# Patient Record
Sex: Male | Born: 1960
Health system: Southern US, Community
[De-identification: ages and names within clinical notes are randomized; demographics above are authoritative.]

## PROBLEM LIST (undated history)

## (undated) DIAGNOSIS — K219 Gastro-esophageal reflux disease without esophagitis: Secondary | ICD-10-CM

## (undated) DIAGNOSIS — M1990 Unspecified osteoarthritis, unspecified site: Secondary | ICD-10-CM

## (undated) DIAGNOSIS — G4730 Sleep apnea, unspecified: Secondary | ICD-10-CM

## (undated) DIAGNOSIS — T7840XA Allergy, unspecified, initial encounter: Secondary | ICD-10-CM

## (undated) DIAGNOSIS — J3089 Other allergic rhinitis: Secondary | ICD-10-CM

## (undated) DIAGNOSIS — I2510 Atherosclerotic heart disease of native coronary artery without angina pectoris: Secondary | ICD-10-CM

## (undated) DIAGNOSIS — I10 Essential (primary) hypertension: Secondary | ICD-10-CM

## (undated) DIAGNOSIS — E785 Hyperlipidemia, unspecified: Secondary | ICD-10-CM

## (undated) HISTORY — DX: Other allergic rhinitis: J30.89

## (undated) HISTORY — PX: ACHILLES TENDON REPAIR: SUR1153

## (undated) HISTORY — DX: Hyperlipidemia, unspecified: E78.5

## (undated) HISTORY — PX: COLONOSCOPY: SHX174

## (undated) HISTORY — DX: Sleep apnea, unspecified: G47.30

## (undated) HISTORY — DX: Allergy, unspecified, initial encounter: T78.40XA

## (undated) HISTORY — DX: Gastro-esophageal reflux disease without esophagitis: K21.9

## (undated) HISTORY — DX: Essential (primary) hypertension: I10

## (undated) HISTORY — PX: ELBOW FRACTURE SURGERY: SHX616

## (undated) HISTORY — DX: Atherosclerotic heart disease of native coronary artery without angina pectoris: I25.10

## (undated) HISTORY — PX: WISDOM TOOTH EXTRACTION: SHX21

## (undated) HISTORY — DX: Unspecified osteoarthritis, unspecified site: M19.90

---

## 2015-07-29 HISTORY — PX: COLONOSCOPY: SHX174

## 2016-01-10 ENCOUNTER — Telehealth: Payer: Self-pay | Admitting: Gastroenterology

## 2016-01-10 NOTE — Telephone Encounter (Signed)
Received Colon reports and placed on Dr. Lynne Leader desk for review.

## 2016-01-14 NOTE — Telephone Encounter (Signed)
Dr. Stark reviewed records and has accepted patient. Ok to schedule Direct Colon. Left message for patient to return my call °

## 2016-01-17 ENCOUNTER — Encounter: Payer: Self-pay | Admitting: Gastroenterology

## 2016-01-17 NOTE — Telephone Encounter (Signed)
Colonoscopy scheduled.

## 2016-03-13 ENCOUNTER — Ambulatory Visit (AMBULATORY_SURGERY_CENTER): Payer: Self-pay

## 2016-03-13 VITALS — Ht 66.0 in | Wt 250.0 lb

## 2016-03-13 DIAGNOSIS — Z800 Family history of malignant neoplasm of digestive organs: Secondary | ICD-10-CM

## 2016-03-13 MED ORDER — SUPREP BOWEL PREP KIT 17.5-3.13-1.6 GM/177ML PO SOLN: 1.0000 | Freq: Once | ORAL | 0 refills | Status: AC

## 2016-03-13 NOTE — Progress Notes (Signed)
No allergies to eggs or soy No past problems with anesthesia No diet meds No home oxygen cpap only  Has email and internet; declined emmi

## 2016-03-19 ENCOUNTER — Encounter: Payer: Self-pay | Admitting: Gastroenterology

## 2016-03-27 ENCOUNTER — Encounter: Payer: Self-pay | Admitting: Gastroenterology

## 2016-03-27 ENCOUNTER — Ambulatory Visit (AMBULATORY_SURGERY_CENTER): Payer: 59 | Admitting: Gastroenterology

## 2016-03-27 VITALS — BP 147/90 | HR 65 | Temp 97.3°F | Resp 19 | Ht 66.0 in | Wt 250.0 lb

## 2016-03-27 DIAGNOSIS — Z1211 Encounter for screening for malignant neoplasm of colon: Secondary | ICD-10-CM | POA: Diagnosis not present

## 2016-03-27 DIAGNOSIS — Z800 Family history of malignant neoplasm of digestive organs: Secondary | ICD-10-CM

## 2016-03-27 DIAGNOSIS — D120 Benign neoplasm of cecum: Secondary | ICD-10-CM | POA: Diagnosis not present

## 2016-03-27 MED ORDER — SODIUM CHLORIDE 0.9 % IV SOLN: 500.0000 mL | INTRAVENOUS | Status: AC

## 2016-03-27 NOTE — Progress Notes (Signed)
Called to room to assist during endoscopic procedure.  Patient ID and intended procedure confirmed with present staff. Received instructions for my participation in the procedure from the performing physician.  

## 2016-03-27 NOTE — Patient Instructions (Signed)
Discharge instructions given. Handouts on polyps,diverticulosis and hemorrhoids. Resume previous medications. YOU HAD AN ENDOSCOPIC PROCEDURE TODAY AT THE [facility]:   Refer to the procedure report that was given to you for any specific questions about what was found during the examination.  If the procedure report does not answer your questions, please call your gastroenterologist to clarify.  If you requested that your care partner not be given the details of your procedure findings, then the procedure report has been included in a sealed envelope for you to review at your convenience later.  YOU SHOULD EXPECT: Some feelings of bloating in the abdomen. Passage of more gas than usual.  Walking can help get rid of the air that was put into your GI tract during the procedure and reduce the bloating. If you had a lower endoscopy (such as a colonoscopy or flexible sigmoidoscopy) you may notice spotting of blood in your stool or on the toilet paper. If you underwent a bowel prep for your procedure, you may not have a normal bowel movement for a few days.  Please Note:  You might notice some irritation and congestion in your nose or some drainage.  This is from the oxygen used during your procedure.  There is no need for concern and it should clear up in a day or so.  SYMPTOMS TO REPORT IMMEDIATELY:   Following lower endoscopy (colonoscopy or flexible sigmoidoscopy):  Excessive amounts of blood in the stool  Significant tenderness or worsening of abdominal pains  Swelling of the abdomen that is new, acute  Fever of 100F or higher   For urgent or emergent issues, a gastroenterologist can be reached at any hour by calling [phone].   DIET:  We do recommend a small meal at first, but then you may proceed to your regular diet.  Drink plenty of fluids but you should avoid alcoholic beverages for 24 hours.  ACTIVITY:  You should plan to take it easy for the rest of today and you  should NOT DRIVE or use heavy machinery until tomorrow (because of the sedation medicines used during the test).    FOLLOW UP: Our staff will call the number listed on your records the next business day following your procedure to check on you and address any questions or concerns that you may have regarding the information given to you following your procedure. If we do not reach you, we will leave a message.  However, if you are feeling well and you are not experiencing any problems, there is no need to return our call.  We will assume that you have returned to your regular daily activities without incident.  If any biopsies were taken you will be contacted by phone or by letter within the next 1-3 weeks.  Please call us at [phone] if you have not heard about the biopsies in 3 weeks.    SIGNATURES/CONFIDENTIALITY: You and/or your care partner have signed paperwork which will be entered into your electronic medical record.  These signatures attest to the fact that that the information above on your After Visit Summary has been reviewed and is understood.  Full responsibility of the confidentiality of this discharge information lies with you and/or your care-partner. 

## 2016-03-27 NOTE — Progress Notes (Signed)
Report to PACU, RN, vss, BBS= Clear.  

## 2016-03-27 NOTE — Op Note (Signed)
[place] Patient Name: Norman Stanton Procedure Date: 03/27/2016 9:45 AM MRN: [id number] Endoscopist: Ladene Artist , MD Age: 55 Referring MD:  Date of Birth: [birth year] Gender: Male Account #: [id number] Procedure:                Colonoscopy Indications:              Screening in patient at increased risk: Family                            history of 1st-degree relative with colorectal                            cancer Medicines:                Monitored Anesthesia Care Procedure:                Pre-Anesthesia Assessment:                           - Prior to the procedure, a History and Physical                            was performed, and patient medications and                            allergies were reviewed. The patient's tolerance of                            previous anesthesia was also reviewed. The risks                            and benefits of the procedure and the sedation                            options and risks were discussed with the patient.                            All questions were answered, and informed consent                            was obtained. Prior Anticoagulants: The patient has                            taken no previous anticoagulant or antiplatelet                            agents. ASA Grade Assessment: III - A patient with                            severe systemic disease. After reviewing the risks                            and benefits, the patient was deemed in  satisfactory condition to undergo the procedure.                           After obtaining informed consent, the colonoscope                            was passed under direct vision. Throughout the                            procedure, the patient's blood pressure, pulse, and                            oxygen saturations were monitored continuously. The                            Model PCF-[id number]) scope was introduced                             through the anus and advanced to the the cecum,                            identified by appendiceal orifice and ileocecal                            valve. The ileocecal valve, appendiceal orifice,                            and rectum were photographed. The quality of the                            bowel preparation was good. The colonoscopy was                            performed without difficulty. The patient tolerated                            the procedure well. Scope In: 10:08:46 AM Scope Out: 10:23:47 AM Scope Withdrawal Time: 0 hours 10 minutes 20 seconds  Total Procedure Duration: 0 hours 15 minutes 1 second  Findings:                 A 5 mm polyp was found in the cecum. The polyp was                            sessile. The polyp was removed with a cold biopsy                            forceps. Resection and retrieval were complete.                           Internal hemorrhoids were found during                            retroflexion. The hemorrhoids were small and Grade  I (internal hemorrhoids that do not prolapse).                           The exam was otherwise without abnormality on                            direct and retroflexion views.                           A few small-mouthed diverticula were found in the                            sigmoid colon, descending colon and ascending                            colon. There was no evidence of diverticular                            bleeding. Complications:            No immediate complications. Estimated blood loss:                            None. Estimated Blood Loss:     Estimated blood loss: none. Impression:               - One 5 mm polyp in the cecum, removed with a cold                            biopsy forceps. Resected and retrieved.                           - Internal hemorrhoids.                           - Mild diverticulosis in the sigmoid colon, in the                             descending colon and in the ascending colon.                           - The examination was otherwise normal Recommendation:           - Repeat colonoscopy in 5 years for surveillance.                           - Patient has a contact number available for                            emergencies. The signs and symptoms of potential                            delayed complications were discussed with the                            patient. Return to normal activities tomorrow.  Written discharge instructions were provided to the                            patient.                           - High fiber diet.                           - Continue present medications.                           - Await pathology results. Ladene Artist, MD 03/27/2016 10:28:12 AM This report has been signed electronically.

## 2016-03-28 ENCOUNTER — Telehealth: Payer: Self-pay | Admitting: *Deleted

## 2016-03-28 NOTE — Telephone Encounter (Signed)

## 2016-04-02 ENCOUNTER — Encounter: Payer: Self-pay | Admitting: Gastroenterology

## 2019-02-25 DIAGNOSIS — G4733 Obstructive sleep apnea (adult) (pediatric): Secondary | ICD-10-CM | POA: Diagnosis not present

## 2019-04-28 DIAGNOSIS — I10 Essential (primary) hypertension: Secondary | ICD-10-CM | POA: Diagnosis not present

## 2019-04-28 DIAGNOSIS — E782 Mixed hyperlipidemia: Secondary | ICD-10-CM | POA: Diagnosis not present

## 2019-04-28 DIAGNOSIS — Z7982 Long term (current) use of aspirin: Secondary | ICD-10-CM | POA: Diagnosis not present

## 2019-04-28 DIAGNOSIS — I2510 Atherosclerotic heart disease of native coronary artery without angina pectoris: Secondary | ICD-10-CM | POA: Diagnosis not present

## 2019-10-06 DIAGNOSIS — I10 Essential (primary) hypertension: Secondary | ICD-10-CM | POA: Diagnosis not present

## 2019-10-06 DIAGNOSIS — N401 Enlarged prostate with lower urinary tract symptoms: Secondary | ICD-10-CM | POA: Diagnosis not present

## 2019-10-06 DIAGNOSIS — Z0000 Encounter for general adult medical examination without abnormal findings: Secondary | ICD-10-CM | POA: Diagnosis not present

## 2019-10-06 DIAGNOSIS — N529 Male erectile dysfunction, unspecified: Secondary | ICD-10-CM | POA: Diagnosis not present

## 2019-10-06 DIAGNOSIS — E782 Mixed hyperlipidemia: Secondary | ICD-10-CM | POA: Diagnosis not present

## 2019-10-06 DIAGNOSIS — I2510 Atherosclerotic heart disease of native coronary artery without angina pectoris: Secondary | ICD-10-CM | POA: Diagnosis not present

## 2019-10-06 DIAGNOSIS — N138 Other obstructive and reflux uropathy: Secondary | ICD-10-CM | POA: Diagnosis not present

## 2019-10-06 DIAGNOSIS — Z7982 Long term (current) use of aspirin: Secondary | ICD-10-CM | POA: Diagnosis not present

## 2019-10-11 DIAGNOSIS — Z6841 Body Mass Index (BMI) 40.0 and over, adult: Secondary | ICD-10-CM | POA: Diagnosis not present

## 2019-10-11 DIAGNOSIS — N529 Male erectile dysfunction, unspecified: Secondary | ICD-10-CM | POA: Diagnosis not present

## 2019-10-11 DIAGNOSIS — Z0000 Encounter for general adult medical examination without abnormal findings: Secondary | ICD-10-CM | POA: Diagnosis not present

## 2019-10-12 DIAGNOSIS — G4733 Obstructive sleep apnea (adult) (pediatric): Secondary | ICD-10-CM | POA: Diagnosis not present

## 2019-10-31 DIAGNOSIS — I2510 Atherosclerotic heart disease of native coronary artery without angina pectoris: Secondary | ICD-10-CM | POA: Diagnosis not present

## 2019-10-31 DIAGNOSIS — I10 Essential (primary) hypertension: Secondary | ICD-10-CM | POA: Diagnosis not present

## 2019-10-31 DIAGNOSIS — Z7982 Long term (current) use of aspirin: Secondary | ICD-10-CM | POA: Diagnosis not present

## 2019-10-31 DIAGNOSIS — E782 Mixed hyperlipidemia: Secondary | ICD-10-CM | POA: Diagnosis not present

## 2020-01-04 DIAGNOSIS — G4733 Obstructive sleep apnea (adult) (pediatric): Secondary | ICD-10-CM | POA: Diagnosis not present

## 2020-02-14 DIAGNOSIS — R7303 Prediabetes: Secondary | ICD-10-CM | POA: Diagnosis not present

## 2020-05-02 DIAGNOSIS — L821 Other seborrheic keratosis: Secondary | ICD-10-CM | POA: Diagnosis not present

## 2020-05-02 DIAGNOSIS — D485 Neoplasm of uncertain behavior of skin: Secondary | ICD-10-CM | POA: Diagnosis not present

## 2020-05-02 DIAGNOSIS — D1801 Hemangioma of skin and subcutaneous tissue: Secondary | ICD-10-CM | POA: Diagnosis not present

## 2020-05-02 DIAGNOSIS — C44319 Basal cell carcinoma of skin of other parts of face: Secondary | ICD-10-CM | POA: Diagnosis not present

## 2020-05-07 DIAGNOSIS — I10 Essential (primary) hypertension: Secondary | ICD-10-CM | POA: Diagnosis not present

## 2020-05-07 DIAGNOSIS — Z7982 Long term (current) use of aspirin: Secondary | ICD-10-CM | POA: Diagnosis not present

## 2020-05-07 DIAGNOSIS — E782 Mixed hyperlipidemia: Secondary | ICD-10-CM | POA: Diagnosis not present

## 2020-05-07 DIAGNOSIS — I2510 Atherosclerotic heart disease of native coronary artery without angina pectoris: Secondary | ICD-10-CM | POA: Diagnosis not present

## 2020-06-05 DIAGNOSIS — G4733 Obstructive sleep apnea (adult) (pediatric): Secondary | ICD-10-CM | POA: Diagnosis not present

## 2020-06-28 DIAGNOSIS — C44319 Basal cell carcinoma of skin of other parts of face: Secondary | ICD-10-CM | POA: Diagnosis not present

## 2020-08-13 DIAGNOSIS — H524 Presbyopia: Secondary | ICD-10-CM | POA: Diagnosis not present

## 2020-08-13 DIAGNOSIS — Z135 Encounter for screening for eye and ear disorders: Secondary | ICD-10-CM | POA: Diagnosis not present

## 2020-08-13 DIAGNOSIS — H5203 Hypermetropia, bilateral: Secondary | ICD-10-CM | POA: Diagnosis not present

## 2020-08-13 DIAGNOSIS — H52223 Regular astigmatism, bilateral: Secondary | ICD-10-CM | POA: Diagnosis not present

## 2020-11-14 DIAGNOSIS — R079 Chest pain, unspecified: Secondary | ICD-10-CM | POA: Diagnosis not present

## 2020-11-14 DIAGNOSIS — I2510 Atherosclerotic heart disease of native coronary artery without angina pectoris: Secondary | ICD-10-CM | POA: Diagnosis not present

## 2020-11-14 DIAGNOSIS — I10 Essential (primary) hypertension: Secondary | ICD-10-CM | POA: Diagnosis not present

## 2020-11-14 DIAGNOSIS — E782 Mixed hyperlipidemia: Secondary | ICD-10-CM | POA: Diagnosis not present

## 2020-12-11 DIAGNOSIS — I2510 Atherosclerotic heart disease of native coronary artery without angina pectoris: Secondary | ICD-10-CM | POA: Diagnosis not present

## 2020-12-14 DIAGNOSIS — M25511 Pain in right shoulder: Secondary | ICD-10-CM | POA: Diagnosis not present

## 2020-12-14 DIAGNOSIS — I2510 Atherosclerotic heart disease of native coronary artery without angina pectoris: Secondary | ICD-10-CM | POA: Diagnosis not present

## 2020-12-14 DIAGNOSIS — E785 Hyperlipidemia, unspecified: Secondary | ICD-10-CM | POA: Diagnosis not present

## 2020-12-14 DIAGNOSIS — Z125 Encounter for screening for malignant neoplasm of prostate: Secondary | ICD-10-CM | POA: Diagnosis not present

## 2020-12-14 DIAGNOSIS — R7303 Prediabetes: Secondary | ICD-10-CM | POA: Diagnosis not present

## 2020-12-14 DIAGNOSIS — Z0000 Encounter for general adult medical examination without abnormal findings: Secondary | ICD-10-CM | POA: Diagnosis not present

## 2020-12-14 DIAGNOSIS — M25551 Pain in right hip: Secondary | ICD-10-CM | POA: Diagnosis not present

## 2020-12-14 DIAGNOSIS — I10 Essential (primary) hypertension: Secondary | ICD-10-CM | POA: Diagnosis not present

## 2020-12-18 DIAGNOSIS — G4733 Obstructive sleep apnea (adult) (pediatric): Secondary | ICD-10-CM | POA: Diagnosis not present

## 2021-01-03 DIAGNOSIS — R079 Chest pain, unspecified: Secondary | ICD-10-CM | POA: Diagnosis not present

## 2021-01-03 DIAGNOSIS — I2510 Atherosclerotic heart disease of native coronary artery without angina pectoris: Secondary | ICD-10-CM | POA: Diagnosis not present

## 2021-02-05 ENCOUNTER — Other Ambulatory Visit: Payer: Self-pay

## 2021-02-05 ENCOUNTER — Other Ambulatory Visit: Payer: Self-pay | Admitting: Physician Assistant

## 2021-02-05 ENCOUNTER — Ambulatory Visit
Admission: RE | Admit: 2021-02-05 | Discharge: 2021-02-05 | Disposition: A | Discharge status: Home / Self Care | Payer: BC Managed Care – PPO | Source: Ambulatory Visit | Attending: Physician Assistant | Admitting: Physician Assistant

## 2021-02-05 DIAGNOSIS — M25551 Pain in right hip: Secondary | ICD-10-CM | POA: Diagnosis not present

## 2021-02-05 DIAGNOSIS — M19011 Primary osteoarthritis, right shoulder: Secondary | ICD-10-CM | POA: Diagnosis not present

## 2021-02-05 DIAGNOSIS — M160 Bilateral primary osteoarthritis of hip: Secondary | ICD-10-CM | POA: Diagnosis not present

## 2021-02-05 DIAGNOSIS — M25511 Pain in right shoulder: Secondary | ICD-10-CM

## 2021-02-05 DIAGNOSIS — M25711 Osteophyte, right shoulder: Secondary | ICD-10-CM | POA: Diagnosis not present

## 2021-02-05 DIAGNOSIS — M25751 Osteophyte, right hip: Secondary | ICD-10-CM | POA: Diagnosis not present

## 2021-02-12 DIAGNOSIS — I10 Essential (primary) hypertension: Secondary | ICD-10-CM | POA: Diagnosis not present

## 2021-02-12 DIAGNOSIS — I2510 Atherosclerotic heart disease of native coronary artery without angina pectoris: Secondary | ICD-10-CM | POA: Diagnosis not present

## 2021-02-12 DIAGNOSIS — G4733 Obstructive sleep apnea (adult) (pediatric): Secondary | ICD-10-CM | POA: Diagnosis not present

## 2021-03-05 ENCOUNTER — Encounter (HOSPITAL_BASED_OUTPATIENT_CLINIC_OR_DEPARTMENT_OTHER): Payer: Self-pay

## 2021-03-05 DIAGNOSIS — R0683 Snoring: Secondary | ICD-10-CM

## 2021-03-05 DIAGNOSIS — G4733 Obstructive sleep apnea (adult) (pediatric): Secondary | ICD-10-CM

## 2021-03-05 DIAGNOSIS — G4710 Hypersomnia, unspecified: Secondary | ICD-10-CM

## 2021-03-08 ENCOUNTER — Encounter: Payer: Self-pay | Admitting: Gastroenterology

## 2021-03-15 ENCOUNTER — Ambulatory Visit (HOSPITAL_BASED_OUTPATIENT_CLINIC_OR_DEPARTMENT_OTHER): Payer: BC Managed Care – PPO | Attending: Sleep Medicine | Admitting: Sleep Medicine

## 2021-03-15 ENCOUNTER — Other Ambulatory Visit: Payer: Self-pay

## 2021-03-15 VITALS — Ht 66.0 in | Wt 248.0 lb

## 2021-03-15 DIAGNOSIS — G4731 Primary central sleep apnea: Secondary | ICD-10-CM

## 2021-03-15 DIAGNOSIS — G4733 Obstructive sleep apnea (adult) (pediatric): Secondary | ICD-10-CM | POA: Diagnosis present

## 2021-03-15 DIAGNOSIS — G4737 Central sleep apnea in conditions classified elsewhere: Secondary | ICD-10-CM | POA: Insufficient documentation

## 2021-03-15 DIAGNOSIS — G4710 Hypersomnia, unspecified: Secondary | ICD-10-CM

## 2021-03-15 DIAGNOSIS — R0683 Snoring: Secondary | ICD-10-CM

## 2021-03-21 DIAGNOSIS — G4733 Obstructive sleep apnea (adult) (pediatric): Secondary | ICD-10-CM | POA: Diagnosis not present

## 2021-03-21 NOTE — Procedures (Signed)
   NAME: Norman Stanton DATE OF BIRTH:  Jan 18, 1961 MEDICAL RECORD NUMBER [id number]  LOCATION: [facility]  PHYSICIAN: Keyetta Hollingworth D Ruchel Brandenburger  DATE OF STUDY: 03/15/2021  SLEEP STUDY TYPE: Nocturnal Polysomnogram               REFERRING PHYSICIAN: Elmarie Mainland, MD  CLINICAL INFORMATION Norman Stanton is a 60 year old Male and was referred to the sleep center for reassessment of obstructive sleep apnea.  MEDICATIONS Patient self administered medications include: N/A. No sleep medicine administered.  SLEEP STUDY TECHNIQUE A multi-channel overnight Polysomnography study was performed. The channels recorded and monitored were central and occipital EEG, electrooculogram (EOG), submentalis EMG (chin), nasal and oral airflow, thoracic and abdominal wall motion, anterior tibialis EMG, snore microphone, electrocardiogram, and a pulse oximetry. SLEEP ARCHITECTURE The study was initiated at 9:54:42 PM and terminated at 4:01:24 AM. The total recorded time was 366.7 minutes. EEG confirmed total sleep time was 80 minutes yielding a sleep efficiency of 21.8%. Sleep onset after lights out was 6.9 minutes with a REM latency of 319.5 minutes. The patient spent 20.0% of the night in stage N1 sleep, 60.6% in stage N2 sleep, 0.0% in stage N3 and 19.4% in REM. Wake after sleep onset (WASO) was 279.8 minutes. The Arousal Index was 51.8/hour. RESPIRATORY PARAMETERS There were a total of 98 respiratory disturbances out of which 80 were apneas (26 obstructive, 0 mixed, 54 central) and 18 hypopneas. The apnea/hypopnea index (AHI) was 73.5 events/hour. The central sleep apnea index was 40.5 events/hour. The REM AHI was 31.0 events/hour and NREM AHI was 83.7 events/hour. The supine AHI was 69.6 events/hour and the non supine AHI was 82.5 supine during 70.00% of sleep. Respiratory disturbances were associated with oxygen desaturation down to a nadir of 86.0% during sleep. The mean oxygen saturation during  the study was 93.8%.  LEG MOVEMENT DATA The total leg movements were 0 with a resulting leg movement index of 0.0/hr .Associated arousal with leg movement index was 0.0/hr.  CARDIAC DATA The underlying cardiac rhythm was most consistent with sinus rhythm. Mean heart rate during sleep was 54.8 bpm.  IMPRESSIONS - Severe Obstructive Sleep apnea (OSA) - EKG showed no cardiac abnormalities DIAGNOSIS - Obstructive Sleep Apnea (G47.33) - Central Sleep Apnea (G47.37) RECOMMENDATIONS - CPAP titration to determine optimal pressure required to alleviate sleep disordered breathing. BiPAP or ASV titration may be required to eliminate central sleep apnea. - Avoid alcohol, sedatives and other CNS depressants that may worsen sleep apnea and disrupt normal sleep architecture. - Sleep hygiene should be reviewed to assess factors that may improve sleep quality. - Weight management and regular exercise should be initiated or continued.  Norman Stanton D Aline Wesche Diplomate, American Board of Internal Medicine  ELECTRONICALLY SIGNED ON:  03/21/2021, 12:33 PM [place] PH: [phone]   FX: [phone] [place]

## 2021-04-17 DIAGNOSIS — G4733 Obstructive sleep apnea (adult) (pediatric): Secondary | ICD-10-CM | POA: Diagnosis not present

## 2021-04-17 DIAGNOSIS — I10 Essential (primary) hypertension: Secondary | ICD-10-CM | POA: Diagnosis not present

## 2021-05-09 ENCOUNTER — Other Ambulatory Visit: Payer: Self-pay

## 2021-05-09 ENCOUNTER — Encounter: Payer: Self-pay | Admitting: Gastroenterology

## 2021-05-09 ENCOUNTER — Ambulatory Visit (AMBULATORY_SURGERY_CENTER): Payer: BC Managed Care – PPO

## 2021-05-09 VITALS — Ht 66.0 in | Wt 250.0 lb

## 2021-05-09 DIAGNOSIS — Z86010 Personal history of colonic polyps: Secondary | ICD-10-CM

## 2021-05-09 MED ORDER — PEG-KCL-NACL-NASULF-NA ASC-C 100 G PO SOLR: 1.0000 | Freq: Once | ORAL | 0 refills | Status: AC

## 2021-05-09 NOTE — Progress Notes (Signed)
Pre visit completed via phone call; Patient verified name, DOB, and address; No egg or soy allergy known to patient  No issues known to pt with past sedation with any surgeries or procedures Patient denies ever being told they had issues or difficulty with intubation  No FH of Malignant Hyperthermia Pt is not on diet pills Pt is not on  home 02  Pt is not on blood thinners  Pt denies issues with constipation at this time; No A fib or A flutter NO PA's for preps discussed with pt in PV today  Discussed with pt there will be an out-of-pocket cost for prep and that varies from $0 to 70 +  dollars - pt verbalized understanding  Due to the COVID-19 pandemic we are asking patients to follow certain guidelines in PV and the [place]   Pt aware of COVID protocols and [facility] guidelines

## 2021-05-17 DIAGNOSIS — G4733 Obstructive sleep apnea (adult) (pediatric): Secondary | ICD-10-CM | POA: Diagnosis not present

## 2021-05-17 DIAGNOSIS — I10 Essential (primary) hypertension: Secondary | ICD-10-CM | POA: Diagnosis not present

## 2021-05-20 DIAGNOSIS — I2510 Atherosclerotic heart disease of native coronary artery without angina pectoris: Secondary | ICD-10-CM | POA: Diagnosis not present

## 2021-05-20 DIAGNOSIS — I10 Essential (primary) hypertension: Secondary | ICD-10-CM | POA: Diagnosis not present

## 2021-05-20 DIAGNOSIS — Z7982 Long term (current) use of aspirin: Secondary | ICD-10-CM | POA: Diagnosis not present

## 2021-05-20 DIAGNOSIS — E782 Mixed hyperlipidemia: Secondary | ICD-10-CM | POA: Diagnosis not present

## 2021-05-23 ENCOUNTER — Encounter: Payer: BC Managed Care – PPO | Admitting: Gastroenterology

## 2021-06-05 ENCOUNTER — Encounter: Payer: Self-pay | Admitting: Gastroenterology

## 2021-06-05 ENCOUNTER — Ambulatory Visit (AMBULATORY_SURGERY_CENTER): Payer: BC Managed Care – PPO | Admitting: Gastroenterology

## 2021-06-05 VITALS — BP 133/78 | HR 62 | Temp 98.2°F | Resp 17 | Ht 66.0 in | Wt 250.0 lb

## 2021-06-05 DIAGNOSIS — D122 Benign neoplasm of ascending colon: Secondary | ICD-10-CM

## 2021-06-05 DIAGNOSIS — Z86010 Personal history of colonic polyps: Secondary | ICD-10-CM | POA: Diagnosis not present

## 2021-06-05 DIAGNOSIS — K5730 Diverticulosis of large intestine without perforation or abscess without bleeding: Secondary | ICD-10-CM | POA: Diagnosis not present

## 2021-06-05 DIAGNOSIS — Z800 Family history of malignant neoplasm of digestive organs: Secondary | ICD-10-CM | POA: Diagnosis not present

## 2021-06-05 DIAGNOSIS — K5289 Other specified noninfective gastroenteritis and colitis: Secondary | ICD-10-CM

## 2021-06-05 DIAGNOSIS — K551 Chronic vascular disorders of intestine: Secondary | ICD-10-CM | POA: Diagnosis not present

## 2021-06-05 DIAGNOSIS — K640 First degree hemorrhoids: Secondary | ICD-10-CM

## 2021-06-05 DIAGNOSIS — K635 Polyp of colon: Secondary | ICD-10-CM | POA: Diagnosis not present

## 2021-06-05 DIAGNOSIS — Z1211 Encounter for screening for malignant neoplasm of colon: Secondary | ICD-10-CM | POA: Diagnosis not present

## 2021-06-05 MED ORDER — SODIUM CHLORIDE 0.9 % IV SOLN: 500.0000 mL | Freq: Once | INTRAVENOUS | Status: DC

## 2021-06-05 NOTE — Progress Notes (Signed)
BP 189/105, Labetalol given IV, MD update, vss

## 2021-06-05 NOTE — Progress Notes (Signed)
Report given to PACU, vss 

## 2021-06-05 NOTE — Progress Notes (Signed)
Pt's states no medical or surgical changes since previsit or office visit.    Dt vitals and SF IV.

## 2021-06-05 NOTE — Progress Notes (Signed)
Called to room to assist during endoscopic procedure.  Patient ID and intended procedure confirmed with present staff. Received instructions for my participation in the procedure from the performing physician.  

## 2021-06-05 NOTE — Progress Notes (Signed)
1330 BP 202/110, Labetalol given IV, MD update, vss

## 2021-06-05 NOTE — Op Note (Signed)
[place] Patient Name: Norman Stanton Procedure Date: 06/05/2021 1:38 PM MRN: [id number] Endoscopist: Ladene Artist , MD Age: 60 Referring MD:  Date of Birth: Sep 17, 1960 Gender: Male Account #: [id number] Procedure:                Colonoscopy Indications:              Surveillance: Personal history of adenomatous                            polyps on last colonoscopy 5 years ago, Family                            history of colon cancer, first degree relative. Medicines:                Monitored Anesthesia Care Procedure:                Pre-Anesthesia Assessment:                           - Prior to the procedure, a History and Physical                            was performed, and patient medications and                            allergies were reviewed. The patient's tolerance of                            previous anesthesia was also reviewed. The risks                            and benefits of the procedure and the sedation                            options and risks were discussed with the patient.                            All questions were answered, and informed consent                            was obtained. Prior Anticoagulants: The patient has                            taken no previous anticoagulant or antiplatelet                            agents. ASA Grade Assessment: III - A patient with                            severe systemic disease. After reviewing the risks                            and benefits, the patient was deemed in  satisfactory condition to undergo the procedure.                           After obtaining informed consent, the colonoscope                            was passed under direct vision. Throughout the                            procedure, the patient's blood pressure, pulse, and                            oxygen saturations were monitored continuously. The                            CF [id number]  [id number] was introduced through the anus                            and advanced to the the cecum, identified by                            appendiceal orifice and ileocecal valve. The                            ileocecal valve, appendiceal orifice, and rectum                            were photographed. The quality of the bowel                            preparation was good. The colonoscopy was performed                            without difficulty. The patient tolerated the                            procedure well. Scope In: 1:46:54 PM Scope Out: 2:04:25 PM Scope Withdrawal Time: 0 hours 13 minutes 6 seconds  Total Procedure Duration: 0 hours 17 minutes 31 seconds  Findings:                 External hemorrhoids were found on perianal exam.                           A 7 mm polyp was found in the ascending colon. The                            polyp was sessile. The polyp was removed with a                            cold snare. Resection and retrieval were complete.                           Multiple medium-mouthed diverticula were found in  the sigmoid colon, descending colon and ascending                            colon. There was no evidence of diverticular                            bleeding.                           Segmental mild mucosal changes characterized by                            erythema and granularity were found in the                            descending colon and at the splenic flexure.                            Biopsies were taken with a cold forceps for                            histology.                           External and internal hemorrhoids were found during                            retroflexion. The hemorrhoids were small and Grade                            I (internal hemorrhoids that do not prolapse).                           The exam was otherwise without abnormality on                            direct and  retroflexion views. Complications:            No immediate complications. Estimated blood loss:                            None. Estimated Blood Loss:     Estimated blood loss: none. Impression:               - External hemorrhoids found on perianal exam.                           - One 7 mm polyp in the ascending colon, removed                            with a cold snare. Resected and retrieved.                           - Mild diverticulosis in the sigmoid colon, in the  descending colon and in the ascending colon.                           - Segmental mild mucosal changes were found in the                            descending colon and at the splenic flexure                            secondary to colitis. Biopsied.                           - External and internal hemorrhoids.                           - The examination was otherwise normal on direct                            and retroflexion views. Recommendation:           - Repeat colonoscopy date, likely 5 years, to be                            determined after pending pathology results are                            reviewed for surveillance based on pathology                            results.                           - Patient has a contact number available for                            emergencies. The signs and symptoms of potential                            delayed complications were discussed with the                            patient. Return to normal activities tomorrow.                            Written discharge instructions were provided to the                            patient.                           - High fiber diet.                           - Continue present medications.                           - Preparation  H supp/cream (OTC) PR bid prn                            hemorrhoid symptoms                           - Await pathology results. Ladene Artist, MD 06/05/2021  2:10:24 PM This report has been signed electronically.

## 2021-06-05 NOTE — Patient Instructions (Signed)
YOU HAD AN ENDOSCOPIC PROCEDURE TODAY AT THE [facility]:   Refer to the procedure report that was given to you for any specific questions about what was found during the examination.  If the procedure report does not answer your questions, please call your gastroenterologist to clarify.  If you requested that your care partner not be given the details of your procedure findings, then the procedure report has been included in a sealed envelope for you to review at your convenience later.  YOU SHOULD EXPECT: Some feelings of bloating in the abdomen. Passage of more gas than usual.  Walking can help get rid of the air that was put into your GI tract during the procedure and reduce the bloating. If you had a lower endoscopy (such as a colonoscopy or flexible sigmoidoscopy) you may notice spotting of blood in your stool or on the toilet paper. If you underwent a bowel prep for your procedure, you may not have a normal bowel movement for a few days.  Please Note:  You might notice some irritation and congestion in your nose or some drainage.  This is from the oxygen used during your procedure.  There is no need for concern and it should clear up in a day or so.  SYMPTOMS TO REPORT IMMEDIATELY:   Following lower endoscopy (colonoscopy or flexible sigmoidoscopy):  Excessive amounts of blood in the stool  Significant tenderness or worsening of abdominal pains  Swelling of the abdomen that is new, acute  Fever of 100F or higher   Following upper endoscopy (EGD)  Vomiting of blood or coffee ground material  New chest pain or pain under the shoulder blades  Painful or persistently difficult swallowing  New shortness of breath  Fever of 100F or higher  Black, tarry-looking stools  For urgent or emergent issues, a gastroenterologist can be reached at any hour by calling [phone]. Do not use MyChart messaging for urgent concerns.    DIET:  We do recommend a small meal at first, but  then you may proceed to your regular diet.  Drink plenty of fluids but you should avoid alcoholic beverages for 24 hours.  ACTIVITY:  You should plan to take it easy for the rest of today and you should NOT DRIVE or use heavy machinery until tomorrow (because of the sedation medicines used during the test).    FOLLOW UP: Our staff will call the number listed on your records 48-72 hours following your procedure to check on you and address any questions or concerns that you may have regarding the information given to you following your procedure. If we do not reach you, we will leave a message.  We will attempt to reach you two times.  During this call, we will ask if you have developed any symptoms of COVID 19. If you develop any symptoms (ie: fever, flu-like symptoms, shortness of breath, cough etc.) before then, please call [phone].  If you test positive for Covid 19 in the 2 weeks post procedure, please call and report this information to us.    If any biopsies were taken you will be contacted by phone or by letter within the next 1-3 weeks.  Please call us at [phone] if you have not heard about the biopsies in 3 weeks.    SIGNATURES/CONFIDENTIALITY: You and/or your care partner have signed paperwork which will be entered into your electronic medical record.  These signatures attest to the fact that that the information above on   your After Visit Summary has been reviewed and is understood.  Full responsibility of the confidentiality of this discharge information lies with you and/or your care-partner. 

## 2021-06-05 NOTE — Progress Notes (Signed)
See 05/20/2021 H&P, no changes.

## 2021-06-07 ENCOUNTER — Telehealth: Payer: Self-pay

## 2021-06-07 ENCOUNTER — Telehealth: Payer: Self-pay | Admitting: *Deleted

## 2021-06-07 NOTE — Telephone Encounter (Signed)
Left message

## 2021-06-07 NOTE — Telephone Encounter (Signed)
  Follow up Call-  Call back number 06/05/2021  Post procedure Call Back phone  # [phone]  Permission to leave phone message Yes  Some recent data might be hidden     Patient questions:  Do you have a fever, pain , or abdominal swelling? No. Pain Score  0 *  Have you tolerated food without any problems? Yes.    Have you been able to return to your normal activities? Yes.    Do you have any questions about your discharge instructions: Diet   No. Medications  No. Follow up visit  No.  Do you have questions or concerns about your Care? No.  Actions: * If pain score is 4 or above: No action needed, pain <4.  Have you developed a fever since your procedure? no  2.   Have you had an respiratory symptoms (SOB or cough) since your procedure? no  3.   Have you tested positive for COVID 19 since your procedure no  4.   Have you had any family members/close contacts diagnosed with the COVID 19 since your procedure?  no   If yes to any of these questions please route to Joylene John, RN and Joella Prince, RN

## 2021-06-11 DIAGNOSIS — I2510 Atherosclerotic heart disease of native coronary artery without angina pectoris: Secondary | ICD-10-CM | POA: Diagnosis not present

## 2021-06-11 DIAGNOSIS — I10 Essential (primary) hypertension: Secondary | ICD-10-CM | POA: Diagnosis not present

## 2021-06-11 DIAGNOSIS — G4733 Obstructive sleep apnea (adult) (pediatric): Secondary | ICD-10-CM | POA: Diagnosis not present

## 2021-06-11 DIAGNOSIS — E785 Hyperlipidemia, unspecified: Secondary | ICD-10-CM | POA: Diagnosis not present

## 2021-06-17 ENCOUNTER — Encounter: Payer: Self-pay | Admitting: Gastroenterology

## 2021-06-17 DIAGNOSIS — E785 Hyperlipidemia, unspecified: Secondary | ICD-10-CM | POA: Diagnosis not present

## 2021-06-17 DIAGNOSIS — I10 Essential (primary) hypertension: Secondary | ICD-10-CM | POA: Diagnosis not present

## 2021-06-17 DIAGNOSIS — G4733 Obstructive sleep apnea (adult) (pediatric): Secondary | ICD-10-CM | POA: Diagnosis not present

## 2021-06-17 DIAGNOSIS — E118 Type 2 diabetes mellitus with unspecified complications: Secondary | ICD-10-CM | POA: Diagnosis not present

## 2021-06-17 DIAGNOSIS — I2510 Atherosclerotic heart disease of native coronary artery without angina pectoris: Secondary | ICD-10-CM | POA: Diagnosis not present

## 2021-06-17 DIAGNOSIS — R7989 Other specified abnormal findings of blood chemistry: Secondary | ICD-10-CM | POA: Diagnosis not present

## 2021-07-16 DIAGNOSIS — L821 Other seborrheic keratosis: Secondary | ICD-10-CM | POA: Diagnosis not present

## 2021-07-16 DIAGNOSIS — D1801 Hemangioma of skin and subcutaneous tissue: Secondary | ICD-10-CM | POA: Diagnosis not present

## 2021-07-16 DIAGNOSIS — B078 Other viral warts: Secondary | ICD-10-CM | POA: Diagnosis not present

## 2021-07-16 DIAGNOSIS — L578 Other skin changes due to chronic exposure to nonionizing radiation: Secondary | ICD-10-CM | POA: Diagnosis not present

## 2021-07-16 DIAGNOSIS — L570 Actinic keratosis: Secondary | ICD-10-CM | POA: Diagnosis not present

## 2021-07-17 DIAGNOSIS — I10 Essential (primary) hypertension: Secondary | ICD-10-CM | POA: Diagnosis not present

## 2021-07-17 DIAGNOSIS — G4733 Obstructive sleep apnea (adult) (pediatric): Secondary | ICD-10-CM | POA: Diagnosis not present

## 2021-08-01 DIAGNOSIS — E291 Testicular hypofunction: Secondary | ICD-10-CM | POA: Diagnosis not present

## 2021-08-01 DIAGNOSIS — R3912 Poor urinary stream: Secondary | ICD-10-CM | POA: Diagnosis not present

## 2021-08-01 DIAGNOSIS — N401 Enlarged prostate with lower urinary tract symptoms: Secondary | ICD-10-CM | POA: Diagnosis not present

## 2021-08-01 DIAGNOSIS — R3911 Hesitancy of micturition: Secondary | ICD-10-CM | POA: Diagnosis not present

## 2021-08-13 DIAGNOSIS — G4733 Obstructive sleep apnea (adult) (pediatric): Secondary | ICD-10-CM | POA: Diagnosis not present

## 2021-08-13 DIAGNOSIS — I10 Essential (primary) hypertension: Secondary | ICD-10-CM | POA: Diagnosis not present

## 2021-09-17 DIAGNOSIS — I10 Essential (primary) hypertension: Secondary | ICD-10-CM | POA: Diagnosis not present

## 2021-09-17 DIAGNOSIS — E1165 Type 2 diabetes mellitus with hyperglycemia: Secondary | ICD-10-CM | POA: Diagnosis not present

## 2021-09-18 DIAGNOSIS — H524 Presbyopia: Secondary | ICD-10-CM | POA: Diagnosis not present

## 2021-09-18 DIAGNOSIS — E119 Type 2 diabetes mellitus without complications: Secondary | ICD-10-CM | POA: Diagnosis not present

## 2021-09-18 DIAGNOSIS — H5203 Hypermetropia, bilateral: Secondary | ICD-10-CM | POA: Diagnosis not present

## 2021-09-18 DIAGNOSIS — H52223 Regular astigmatism, bilateral: Secondary | ICD-10-CM | POA: Diagnosis not present

## 2021-09-19 DIAGNOSIS — I10 Essential (primary) hypertension: Secondary | ICD-10-CM | POA: Diagnosis not present

## 2021-09-19 DIAGNOSIS — E118 Type 2 diabetes mellitus with unspecified complications: Secondary | ICD-10-CM | POA: Diagnosis not present

## 2021-09-19 DIAGNOSIS — I2510 Atherosclerotic heart disease of native coronary artery without angina pectoris: Secondary | ICD-10-CM | POA: Diagnosis not present

## 2021-09-26 DIAGNOSIS — E291 Testicular hypofunction: Secondary | ICD-10-CM | POA: Diagnosis not present

## 2021-09-26 DIAGNOSIS — N401 Enlarged prostate with lower urinary tract symptoms: Secondary | ICD-10-CM | POA: Diagnosis not present

## 2021-09-26 DIAGNOSIS — R3911 Hesitancy of micturition: Secondary | ICD-10-CM | POA: Diagnosis not present

## 2021-10-03 DIAGNOSIS — E291 Testicular hypofunction: Secondary | ICD-10-CM | POA: Diagnosis not present

## 2021-10-03 DIAGNOSIS — N401 Enlarged prostate with lower urinary tract symptoms: Secondary | ICD-10-CM | POA: Diagnosis not present

## 2021-10-03 DIAGNOSIS — R3911 Hesitancy of micturition: Secondary | ICD-10-CM | POA: Diagnosis not present

## 2021-10-03 DIAGNOSIS — R972 Elevated prostate specific antigen [PSA]: Secondary | ICD-10-CM | POA: Diagnosis not present

## 2021-10-22 DIAGNOSIS — E291 Testicular hypofunction: Secondary | ICD-10-CM | POA: Diagnosis not present

## 2021-10-22 DIAGNOSIS — N401 Enlarged prostate with lower urinary tract symptoms: Secondary | ICD-10-CM | POA: Diagnosis not present

## 2021-10-22 DIAGNOSIS — R3911 Hesitancy of micturition: Secondary | ICD-10-CM | POA: Diagnosis not present

## 2021-10-29 DIAGNOSIS — N401 Enlarged prostate with lower urinary tract symptoms: Secondary | ICD-10-CM | POA: Diagnosis not present

## 2021-10-29 DIAGNOSIS — E291 Testicular hypofunction: Secondary | ICD-10-CM | POA: Diagnosis not present

## 2021-10-29 DIAGNOSIS — R3912 Poor urinary stream: Secondary | ICD-10-CM | POA: Diagnosis not present

## 2021-10-29 DIAGNOSIS — R972 Elevated prostate specific antigen [PSA]: Secondary | ICD-10-CM | POA: Diagnosis not present

## 2021-11-20 DIAGNOSIS — I10 Essential (primary) hypertension: Secondary | ICD-10-CM | POA: Diagnosis not present

## 2021-11-20 DIAGNOSIS — E782 Mixed hyperlipidemia: Secondary | ICD-10-CM | POA: Diagnosis not present

## 2021-11-20 DIAGNOSIS — Z7982 Long term (current) use of aspirin: Secondary | ICD-10-CM | POA: Diagnosis not present

## 2021-11-20 DIAGNOSIS — I2510 Atherosclerotic heart disease of native coronary artery without angina pectoris: Secondary | ICD-10-CM | POA: Diagnosis not present

## 2021-12-10 DIAGNOSIS — I10 Essential (primary) hypertension: Secondary | ICD-10-CM | POA: Diagnosis not present

## 2021-12-10 DIAGNOSIS — I2510 Atherosclerotic heart disease of native coronary artery without angina pectoris: Secondary | ICD-10-CM | POA: Diagnosis not present

## 2021-12-10 DIAGNOSIS — G4733 Obstructive sleep apnea (adult) (pediatric): Secondary | ICD-10-CM | POA: Diagnosis not present

## 2021-12-10 DIAGNOSIS — E785 Hyperlipidemia, unspecified: Secondary | ICD-10-CM | POA: Diagnosis not present

## 2021-12-31 DIAGNOSIS — E291 Testicular hypofunction: Secondary | ICD-10-CM | POA: Diagnosis not present

## 2021-12-31 DIAGNOSIS — R3912 Poor urinary stream: Secondary | ICD-10-CM | POA: Diagnosis not present

## 2021-12-31 DIAGNOSIS — N401 Enlarged prostate with lower urinary tract symptoms: Secondary | ICD-10-CM | POA: Diagnosis not present

## 2022-01-07 DIAGNOSIS — R972 Elevated prostate specific antigen [PSA]: Secondary | ICD-10-CM | POA: Diagnosis not present

## 2022-01-07 DIAGNOSIS — N401 Enlarged prostate with lower urinary tract symptoms: Secondary | ICD-10-CM | POA: Diagnosis not present

## 2022-01-07 DIAGNOSIS — R3912 Poor urinary stream: Secondary | ICD-10-CM | POA: Diagnosis not present

## 2022-01-07 DIAGNOSIS — E291 Testicular hypofunction: Secondary | ICD-10-CM | POA: Diagnosis not present

## 2022-02-13 DIAGNOSIS — I10 Essential (primary) hypertension: Secondary | ICD-10-CM | POA: Diagnosis not present

## 2022-02-13 DIAGNOSIS — G4733 Obstructive sleep apnea (adult) (pediatric): Secondary | ICD-10-CM | POA: Diagnosis not present

## 2022-03-19 DIAGNOSIS — L239 Allergic contact dermatitis, unspecified cause: Secondary | ICD-10-CM | POA: Diagnosis not present

## 2022-03-19 DIAGNOSIS — I10 Essential (primary) hypertension: Secondary | ICD-10-CM | POA: Diagnosis not present

## 2022-03-19 DIAGNOSIS — E118 Type 2 diabetes mellitus with unspecified complications: Secondary | ICD-10-CM | POA: Diagnosis not present

## 2022-03-19 DIAGNOSIS — E785 Hyperlipidemia, unspecified: Secondary | ICD-10-CM | POA: Diagnosis not present

## 2022-04-30 DIAGNOSIS — E538 Deficiency of other specified B group vitamins: Secondary | ICD-10-CM | POA: Diagnosis not present

## 2022-04-30 DIAGNOSIS — I10 Essential (primary) hypertension: Secondary | ICD-10-CM | POA: Diagnosis not present

## 2022-04-30 DIAGNOSIS — M2550 Pain in unspecified joint: Secondary | ICD-10-CM | POA: Diagnosis not present

## 2022-04-30 DIAGNOSIS — M5442 Lumbago with sciatica, left side: Secondary | ICD-10-CM | POA: Diagnosis not present

## 2022-04-30 DIAGNOSIS — E118 Type 2 diabetes mellitus with unspecified complications: Secondary | ICD-10-CM | POA: Diagnosis not present

## 2022-05-29 DIAGNOSIS — L814 Other melanin hyperpigmentation: Secondary | ICD-10-CM | POA: Diagnosis not present

## 2022-05-29 DIAGNOSIS — L578 Other skin changes due to chronic exposure to nonionizing radiation: Secondary | ICD-10-CM | POA: Diagnosis not present

## 2022-05-29 DIAGNOSIS — D229 Melanocytic nevi, unspecified: Secondary | ICD-10-CM | POA: Diagnosis not present

## 2022-05-29 DIAGNOSIS — L821 Other seborrheic keratosis: Secondary | ICD-10-CM | POA: Diagnosis not present

## 2022-05-29 DIAGNOSIS — L570 Actinic keratosis: Secondary | ICD-10-CM | POA: Diagnosis not present

## 2022-06-18 DIAGNOSIS — R768 Other specified abnormal immunological findings in serum: Secondary | ICD-10-CM | POA: Diagnosis not present

## 2022-06-18 DIAGNOSIS — M79642 Pain in left hand: Secondary | ICD-10-CM | POA: Diagnosis not present

## 2022-06-18 DIAGNOSIS — M79641 Pain in right hand: Secondary | ICD-10-CM | POA: Diagnosis not present

## 2022-06-18 DIAGNOSIS — M545 Low back pain, unspecified: Secondary | ICD-10-CM | POA: Diagnosis not present

## 2022-06-20 DIAGNOSIS — J019 Acute sinusitis, unspecified: Secondary | ICD-10-CM | POA: Diagnosis not present

## 2022-07-01 DIAGNOSIS — N401 Enlarged prostate with lower urinary tract symptoms: Secondary | ICD-10-CM | POA: Diagnosis not present

## 2022-07-01 DIAGNOSIS — R6882 Decreased libido: Secondary | ICD-10-CM | POA: Diagnosis not present

## 2022-07-01 DIAGNOSIS — R3912 Poor urinary stream: Secondary | ICD-10-CM | POA: Diagnosis not present

## 2022-07-07 DIAGNOSIS — M47816 Spondylosis without myelopathy or radiculopathy, lumbar region: Secondary | ICD-10-CM | POA: Diagnosis not present

## 2022-07-07 DIAGNOSIS — M545 Low back pain, unspecified: Secondary | ICD-10-CM | POA: Diagnosis not present

## 2022-07-08 DIAGNOSIS — N401 Enlarged prostate with lower urinary tract symptoms: Secondary | ICD-10-CM | POA: Diagnosis not present

## 2022-07-08 DIAGNOSIS — I10 Essential (primary) hypertension: Secondary | ICD-10-CM | POA: Diagnosis not present

## 2022-07-08 DIAGNOSIS — E291 Testicular hypofunction: Secondary | ICD-10-CM | POA: Diagnosis not present

## 2022-07-08 DIAGNOSIS — G4733 Obstructive sleep apnea (adult) (pediatric): Secondary | ICD-10-CM | POA: Diagnosis not present

## 2022-07-08 DIAGNOSIS — R3912 Poor urinary stream: Secondary | ICD-10-CM | POA: Diagnosis not present

## 2022-07-08 DIAGNOSIS — R972 Elevated prostate specific antigen [PSA]: Secondary | ICD-10-CM | POA: Diagnosis not present

## 2022-07-14 DIAGNOSIS — I2510 Atherosclerotic heart disease of native coronary artery without angina pectoris: Secondary | ICD-10-CM | POA: Diagnosis not present

## 2022-07-14 DIAGNOSIS — Z7982 Long term (current) use of aspirin: Secondary | ICD-10-CM | POA: Diagnosis not present

## 2022-07-14 DIAGNOSIS — I10 Essential (primary) hypertension: Secondary | ICD-10-CM | POA: Diagnosis not present

## 2022-07-14 DIAGNOSIS — E782 Mixed hyperlipidemia: Secondary | ICD-10-CM | POA: Diagnosis not present

## 2022-07-17 DIAGNOSIS — M5416 Radiculopathy, lumbar region: Secondary | ICD-10-CM | POA: Diagnosis not present

## 2022-07-18 DIAGNOSIS — E538 Deficiency of other specified B group vitamins: Secondary | ICD-10-CM | POA: Diagnosis not present

## 2022-07-18 DIAGNOSIS — E785 Hyperlipidemia, unspecified: Secondary | ICD-10-CM | POA: Diagnosis not present

## 2022-07-18 DIAGNOSIS — E118 Type 2 diabetes mellitus with unspecified complications: Secondary | ICD-10-CM | POA: Diagnosis not present

## 2022-07-18 DIAGNOSIS — I10 Essential (primary) hypertension: Secondary | ICD-10-CM | POA: Diagnosis not present

## 2022-07-30 DIAGNOSIS — M5416 Radiculopathy, lumbar region: Secondary | ICD-10-CM | POA: Diagnosis not present

## 2022-08-05 DIAGNOSIS — M5416 Radiculopathy, lumbar region: Secondary | ICD-10-CM | POA: Diagnosis not present

## 2022-08-11 DIAGNOSIS — M5416 Radiculopathy, lumbar region: Secondary | ICD-10-CM | POA: Diagnosis not present

## 2022-08-20 DIAGNOSIS — M5416 Radiculopathy, lumbar region: Secondary | ICD-10-CM | POA: Diagnosis not present

## 2022-10-17 ENCOUNTER — Other Ambulatory Visit (HOSPITAL_COMMUNITY): Payer: Self-pay

## 2022-10-17 DIAGNOSIS — E118 Type 2 diabetes mellitus with unspecified complications: Secondary | ICD-10-CM | POA: Diagnosis not present

## 2022-10-17 DIAGNOSIS — E785 Hyperlipidemia, unspecified: Secondary | ICD-10-CM | POA: Diagnosis not present

## 2022-10-17 DIAGNOSIS — I2510 Atherosclerotic heart disease of native coronary artery without angina pectoris: Secondary | ICD-10-CM | POA: Diagnosis not present

## 2022-10-17 DIAGNOSIS — I10 Essential (primary) hypertension: Secondary | ICD-10-CM | POA: Diagnosis not present

## 2022-10-17 MED ORDER — MOUNJARO 10 MG/0.5ML ~~LOC~~ SOAJ
10.0000 mg | SUBCUTANEOUS | 5 refills | Status: AC
  Filled 2022-10-17: qty 2, 28d supply, fill #0

## 2022-12-15 DIAGNOSIS — G4733 Obstructive sleep apnea (adult) (pediatric): Secondary | ICD-10-CM | POA: Diagnosis not present

## 2023-01-01 DIAGNOSIS — E291 Testicular hypofunction: Secondary | ICD-10-CM | POA: Diagnosis not present

## 2023-01-01 DIAGNOSIS — N401 Enlarged prostate with lower urinary tract symptoms: Secondary | ICD-10-CM | POA: Diagnosis not present

## 2023-01-01 DIAGNOSIS — R3912 Poor urinary stream: Secondary | ICD-10-CM | POA: Diagnosis not present

## 2023-01-08 IMAGING — CR DG SHOULDER 2+V*R*
3 series · 3 of 3 positions shown · non-contrast
Comparison: None.

CLINICAL DATA: Intermittent right upper shoulder pain for 6 months

EXAM:
RIGHT SHOULDER - 2+ VIEW

[w shoulder grashey right]
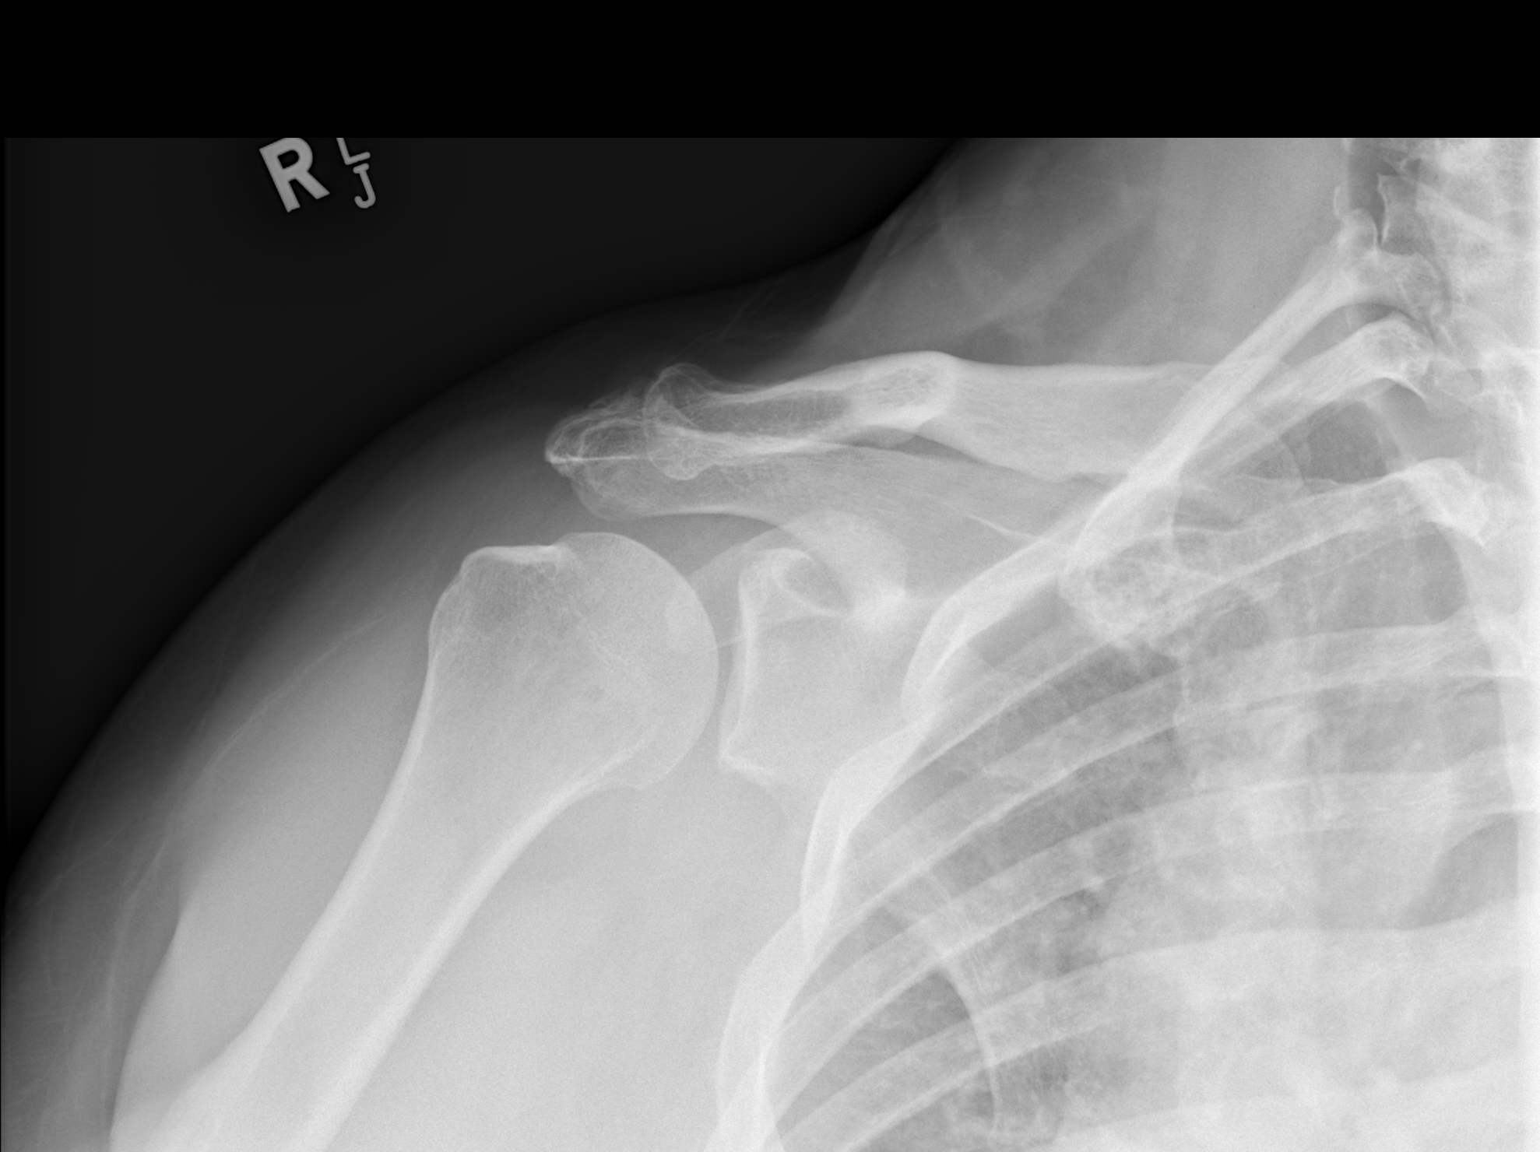

[w shoulder y-view right]
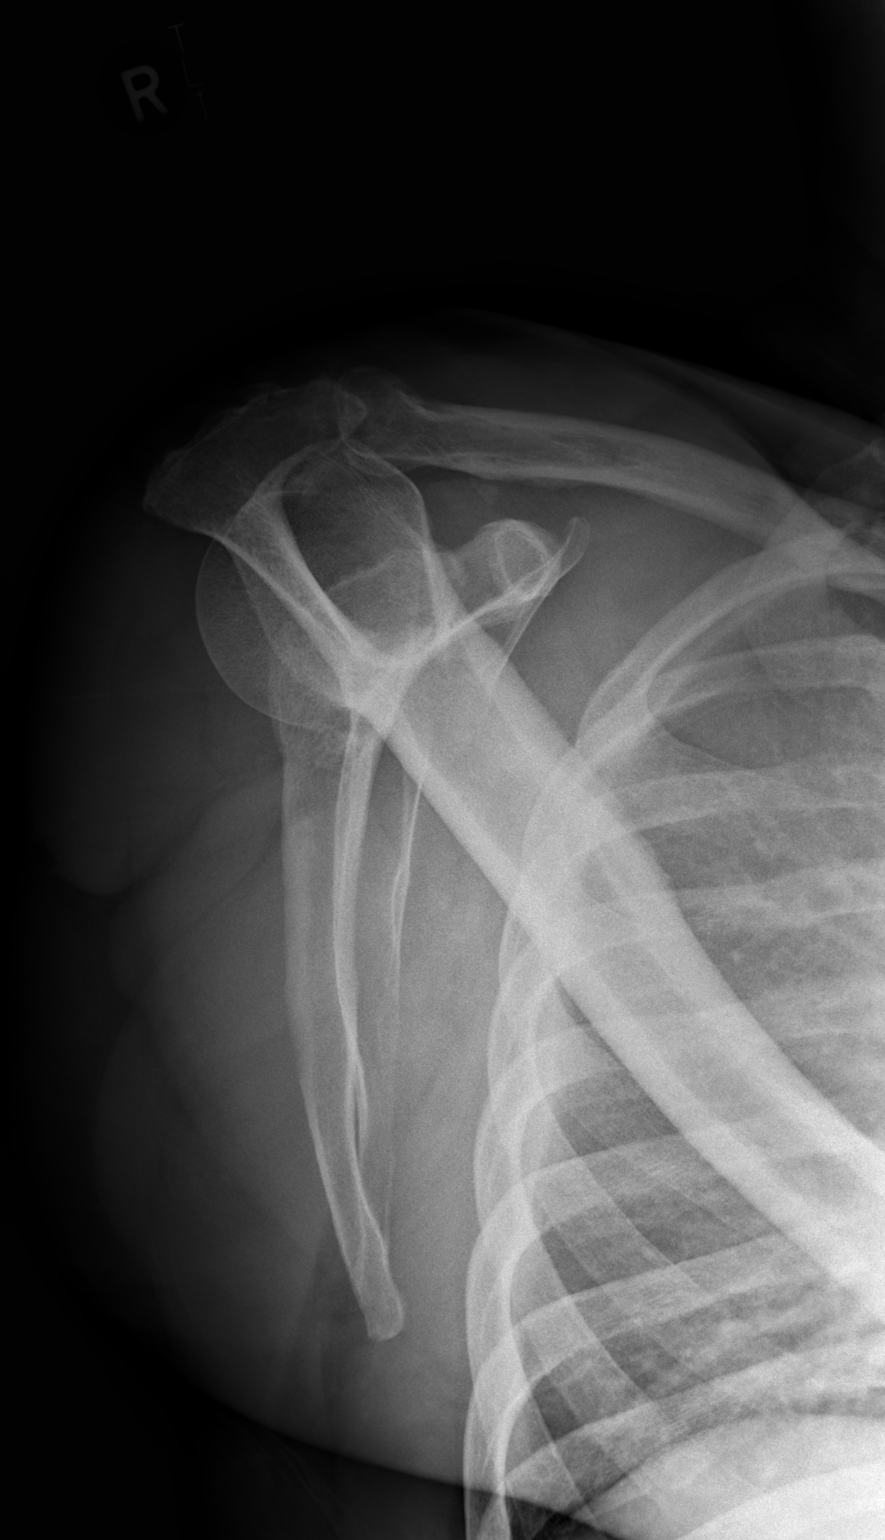

[w shoulder axillary right]
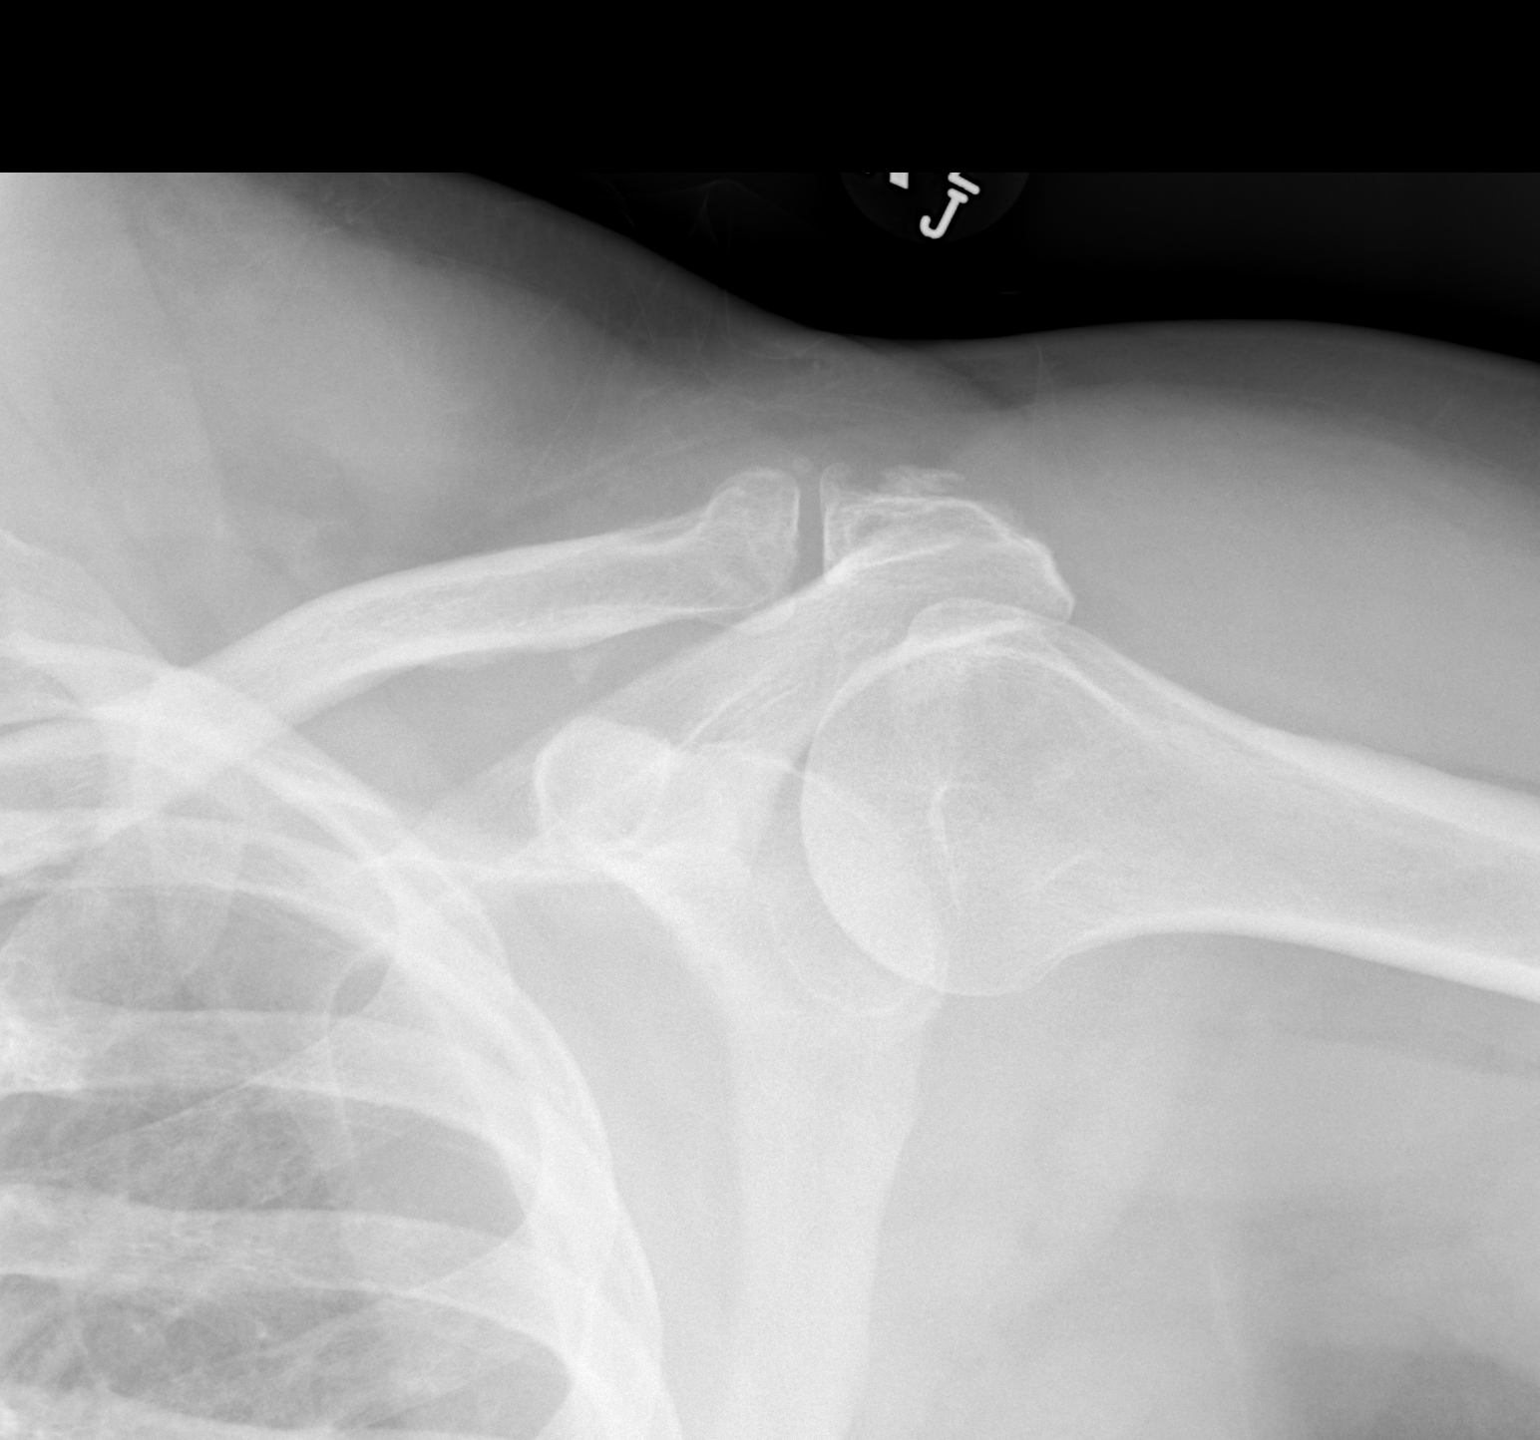

[3 of 3 positions shown; findings below may reference images not displayed]

FINDINGS: Mild spurring of the glenoid and AC joint. Type 2 acromion. No
malalignment. No fracture or acute bony findings.
IMPRESSION: 1. Mild degenerative spurring of the AC joint and glenoid.

## 2023-01-08 IMAGING — CR DG HIP (WITH OR WITHOUT PELVIS) 2-3V*R*
3 series · 3 of 3 positions shown · non-contrast
Comparison: None.

CLINICAL DATA: Remote fall with right hip injury, continued pain.

EXAM:
DG HIP (WITH OR WITHOUT PELVIS) 2-3V RIGHT

[w pelvis upright]
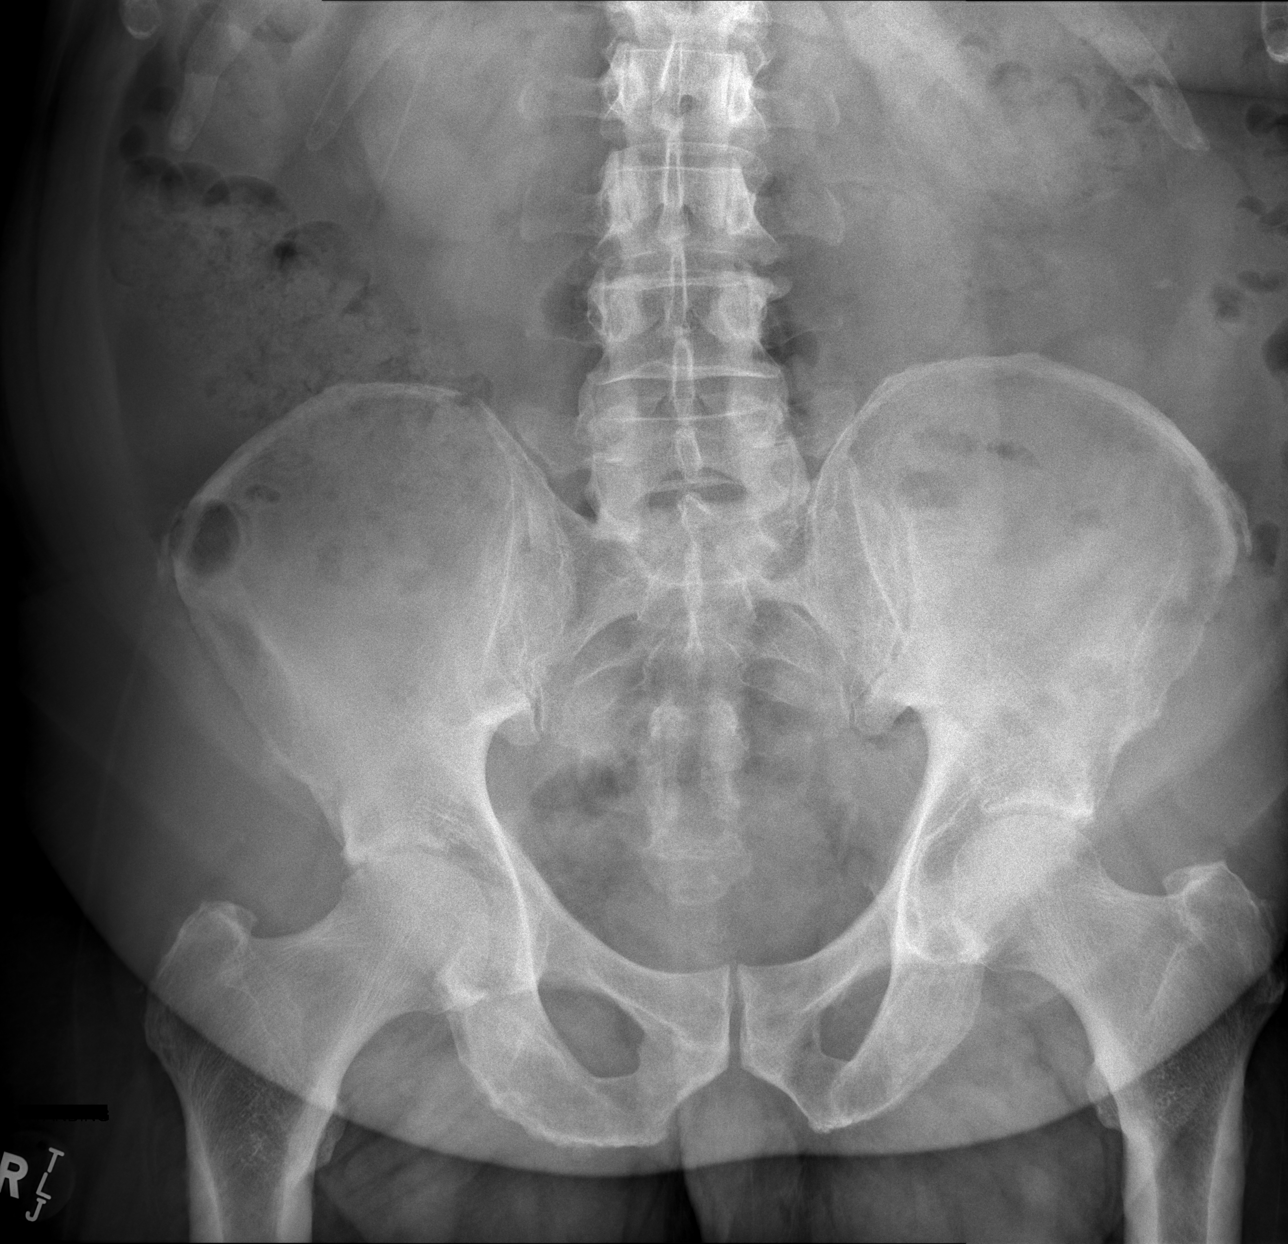

[w hip ap right]
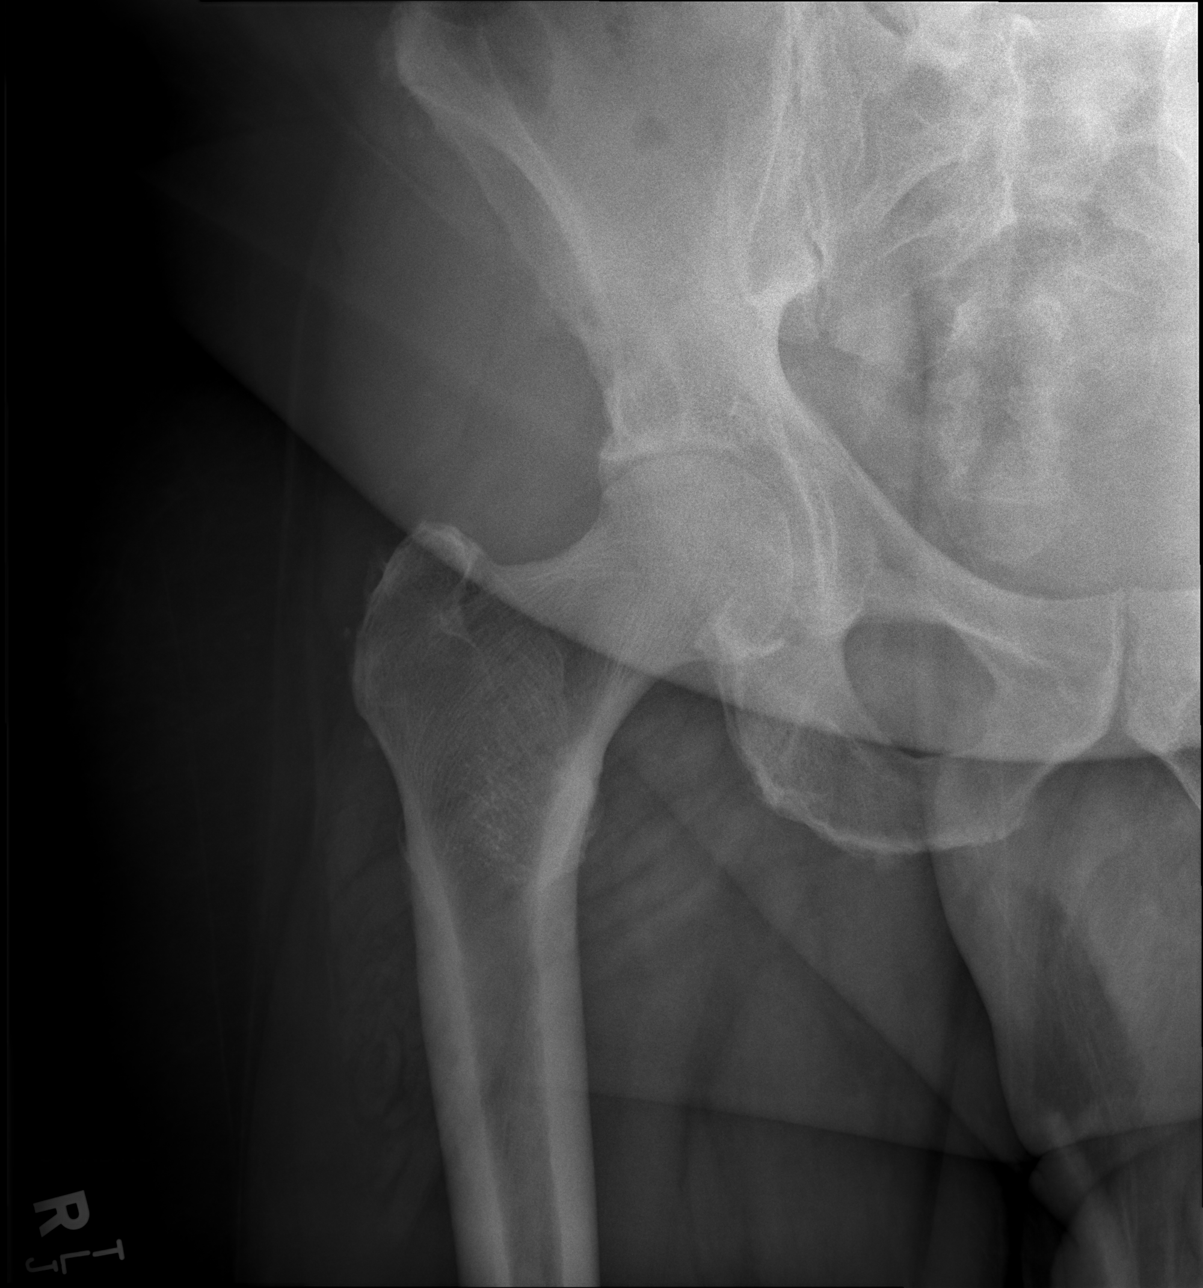

[w hip frog right]
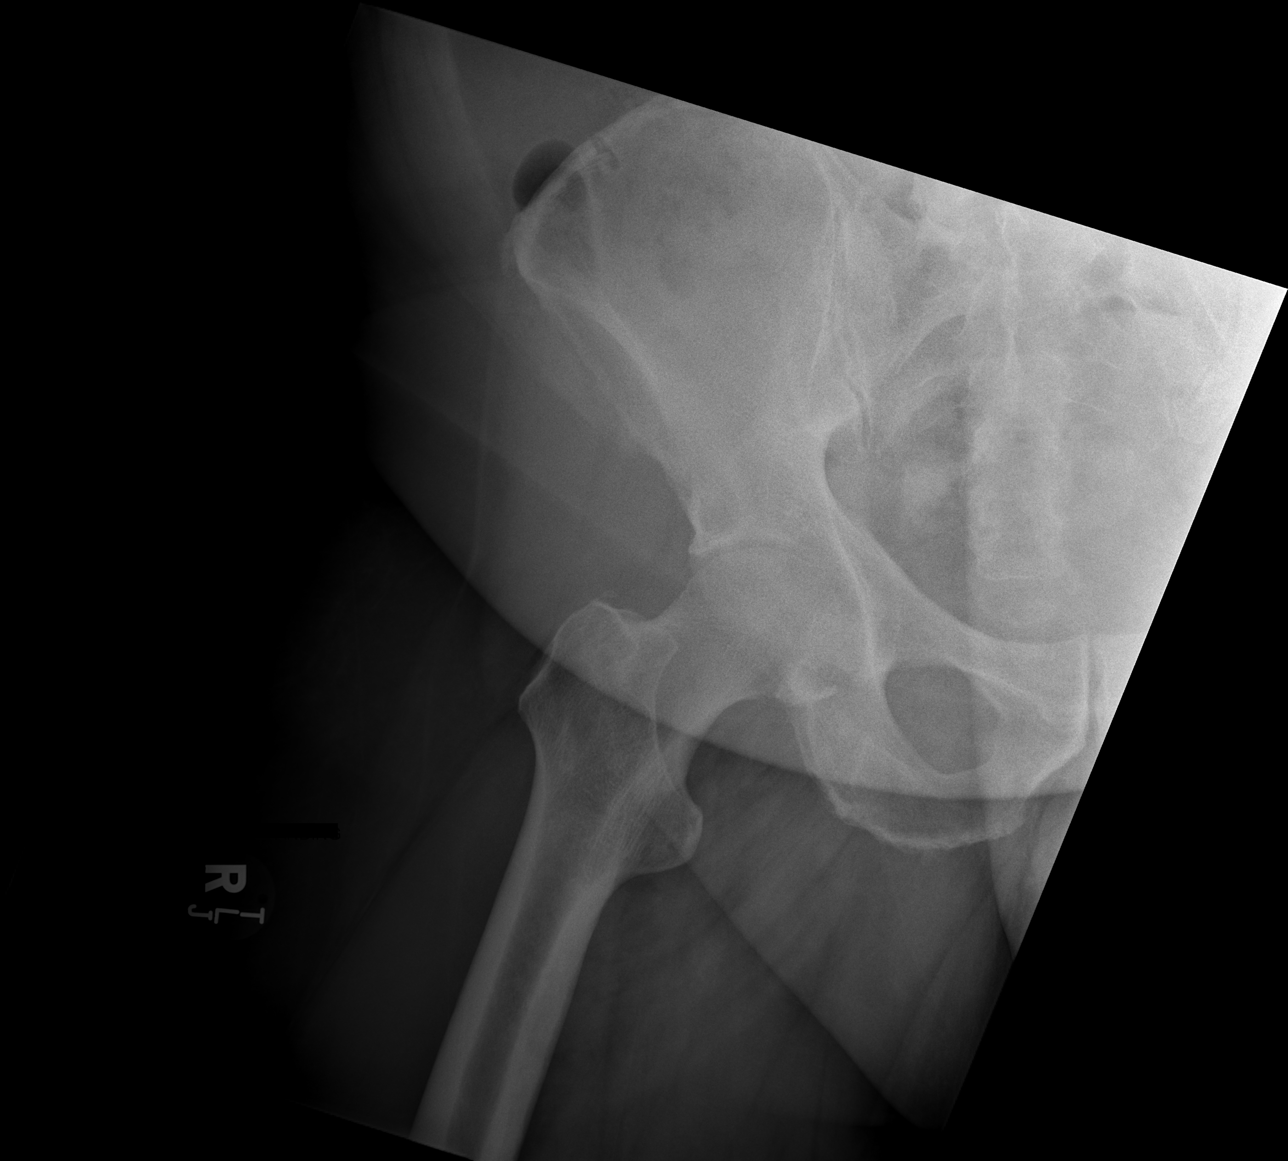

[3 of 3 positions shown; findings below may reference images not displayed]

FINDINGS: Moderate loss of articular space in the right hip with associated
spurring, mild subcortical sclerosis, and some faint indistinctness
of the cortical margin of the acetabular roof. Mild chondral
thinning of in the contralateral (left) hip. Mild spurring of the
right femoral head. No fracture or acute bony findings.
IMPRESSION: 1. Asymmetric right greater than left degenerative hip arthropathy.

## 2023-01-15 DIAGNOSIS — N401 Enlarged prostate with lower urinary tract symptoms: Secondary | ICD-10-CM | POA: Diagnosis not present

## 2023-01-15 DIAGNOSIS — I10 Essential (primary) hypertension: Secondary | ICD-10-CM | POA: Diagnosis not present

## 2023-01-15 DIAGNOSIS — G4733 Obstructive sleep apnea (adult) (pediatric): Secondary | ICD-10-CM | POA: Diagnosis not present

## 2023-01-15 DIAGNOSIS — N5201 Erectile dysfunction due to arterial insufficiency: Secondary | ICD-10-CM | POA: Diagnosis not present

## 2023-01-15 DIAGNOSIS — E782 Mixed hyperlipidemia: Secondary | ICD-10-CM | POA: Diagnosis not present

## 2023-01-15 DIAGNOSIS — I2510 Atherosclerotic heart disease of native coronary artery without angina pectoris: Secondary | ICD-10-CM | POA: Diagnosis not present

## 2023-01-15 DIAGNOSIS — R3911 Hesitancy of micturition: Secondary | ICD-10-CM | POA: Diagnosis not present

## 2023-01-15 DIAGNOSIS — E291 Testicular hypofunction: Secondary | ICD-10-CM | POA: Diagnosis not present

## 2023-02-05 DIAGNOSIS — E785 Hyperlipidemia, unspecified: Secondary | ICD-10-CM | POA: Diagnosis not present

## 2023-02-05 DIAGNOSIS — E118 Type 2 diabetes mellitus with unspecified complications: Secondary | ICD-10-CM | POA: Diagnosis not present

## 2023-02-05 DIAGNOSIS — I2510 Atherosclerotic heart disease of native coronary artery without angina pectoris: Secondary | ICD-10-CM | POA: Diagnosis not present

## 2023-02-05 DIAGNOSIS — I10 Essential (primary) hypertension: Secondary | ICD-10-CM | POA: Diagnosis not present

## 2023-02-09 DIAGNOSIS — I10 Essential (primary) hypertension: Secondary | ICD-10-CM | POA: Diagnosis not present

## 2023-02-09 DIAGNOSIS — G4733 Obstructive sleep apnea (adult) (pediatric): Secondary | ICD-10-CM | POA: Diagnosis not present

## 2023-02-17 DIAGNOSIS — E291 Testicular hypofunction: Secondary | ICD-10-CM | POA: Diagnosis not present

## 2023-03-31 DIAGNOSIS — R3911 Hesitancy of micturition: Secondary | ICD-10-CM | POA: Diagnosis not present

## 2023-03-31 DIAGNOSIS — E291 Testicular hypofunction: Secondary | ICD-10-CM | POA: Diagnosis not present

## 2023-03-31 DIAGNOSIS — N401 Enlarged prostate with lower urinary tract symptoms: Secondary | ICD-10-CM | POA: Diagnosis not present

## 2023-04-08 DIAGNOSIS — R972 Elevated prostate specific antigen [PSA]: Secondary | ICD-10-CM | POA: Diagnosis not present

## 2023-04-08 DIAGNOSIS — E291 Testicular hypofunction: Secondary | ICD-10-CM | POA: Diagnosis not present

## 2023-06-02 DIAGNOSIS — D485 Neoplasm of uncertain behavior of skin: Secondary | ICD-10-CM | POA: Diagnosis not present

## 2023-06-02 DIAGNOSIS — L578 Other skin changes due to chronic exposure to nonionizing radiation: Secondary | ICD-10-CM | POA: Diagnosis not present

## 2023-06-02 DIAGNOSIS — L821 Other seborrheic keratosis: Secondary | ICD-10-CM | POA: Diagnosis not present

## 2023-06-02 DIAGNOSIS — L570 Actinic keratosis: Secondary | ICD-10-CM | POA: Diagnosis not present

## 2023-06-02 DIAGNOSIS — L814 Other melanin hyperpigmentation: Secondary | ICD-10-CM | POA: Diagnosis not present

## 2023-06-02 DIAGNOSIS — D229 Melanocytic nevi, unspecified: Secondary | ICD-10-CM | POA: Diagnosis not present

## 2023-06-04 DIAGNOSIS — E291 Testicular hypofunction: Secondary | ICD-10-CM | POA: Diagnosis not present

## 2023-06-04 DIAGNOSIS — R3912 Poor urinary stream: Secondary | ICD-10-CM | POA: Diagnosis not present

## 2023-06-04 DIAGNOSIS — N401 Enlarged prostate with lower urinary tract symptoms: Secondary | ICD-10-CM | POA: Diagnosis not present

## 2023-06-11 DIAGNOSIS — N401 Enlarged prostate with lower urinary tract symptoms: Secondary | ICD-10-CM | POA: Diagnosis not present

## 2023-06-11 DIAGNOSIS — R3912 Poor urinary stream: Secondary | ICD-10-CM | POA: Diagnosis not present

## 2023-06-11 DIAGNOSIS — E291 Testicular hypofunction: Secondary | ICD-10-CM | POA: Diagnosis not present

## 2023-06-11 DIAGNOSIS — R972 Elevated prostate specific antigen [PSA]: Secondary | ICD-10-CM | POA: Diagnosis not present

## 2023-06-19 DIAGNOSIS — I10 Essential (primary) hypertension: Secondary | ICD-10-CM | POA: Diagnosis not present

## 2023-06-19 DIAGNOSIS — G4733 Obstructive sleep apnea (adult) (pediatric): Secondary | ICD-10-CM | POA: Diagnosis not present

## 2023-09-14 ENCOUNTER — Other Ambulatory Visit: Payer: Self-pay | Admitting: Urology

## 2023-09-14 DIAGNOSIS — R972 Elevated prostate specific antigen [PSA]: Secondary | ICD-10-CM

## 2023-11-06 ENCOUNTER — Encounter: Payer: Self-pay | Admitting: Urology

## 2023-11-09 ENCOUNTER — Ambulatory Visit
Admission: RE | Admit: 2023-11-09 | Discharge: 2023-11-09 | Disposition: A | Discharge status: Home / Self Care | Payer: BC Managed Care – PPO | Source: Ambulatory Visit | Attending: Urology

## 2023-11-09 DIAGNOSIS — R972 Elevated prostate specific antigen [PSA]: Secondary | ICD-10-CM

## 2023-11-09 MED ORDER — GADOPICLENOL 0.5 MMOL/ML IV SOLN
10.0000 mL | Freq: Once | INTRAVENOUS | Status: AC | PRN
  Administered 2023-11-09: 10 mL via INTRAVENOUS
